# Patient Record
Sex: Male | Born: 1975 | Hispanic: Yes | Marital: Married | Smoking: Current some day smoker
Health system: Southern US, Community
[De-identification: ages and names within clinical notes are randomized; demographics above are authoritative.]

## PROBLEM LIST (undated history)

## (undated) DIAGNOSIS — Z87442 Personal history of urinary calculi: Secondary | ICD-10-CM

## (undated) HISTORY — PX: NO PAST SURGERIES: SHX2092

---

## 2006-01-12 DIAGNOSIS — Z87442 Personal history of urinary calculi: Secondary | ICD-10-CM

## 2006-01-12 HISTORY — DX: Personal history of urinary calculi: Z87.442

## 2016-10-23 ENCOUNTER — Ambulatory Visit (INDEPENDENT_AMBULATORY_CARE_PROVIDER_SITE_OTHER): Payer: Worker's Compensation

## 2016-10-23 ENCOUNTER — Ambulatory Visit: Payer: Worker's Compensation

## 2016-10-23 ENCOUNTER — Encounter (HOSPITAL_BASED_OUTPATIENT_CLINIC_OR_DEPARTMENT_OTHER): Payer: Self-pay | Admitting: *Deleted

## 2016-10-23 ENCOUNTER — Ambulatory Visit: Payer: Self-pay

## 2016-10-23 ENCOUNTER — Encounter: Payer: Self-pay | Admitting: Physician Assistant

## 2016-10-23 ENCOUNTER — Ambulatory Visit (INDEPENDENT_AMBULATORY_CARE_PROVIDER_SITE_OTHER): Payer: Worker's Compensation | Admitting: Physician Assistant

## 2016-10-23 VITALS — BP 130/83 | HR 77 | Temp 98.7°F | Resp 16

## 2016-10-23 DIAGNOSIS — S82892A Other fracture of left lower leg, initial encounter for closed fracture: Secondary | ICD-10-CM

## 2016-10-23 DIAGNOSIS — M21962 Unspecified acquired deformity of left lower leg: Secondary | ICD-10-CM | POA: Diagnosis not present

## 2016-10-23 MED ORDER — IBUPROFEN 200 MG PO TABS
600.0000 mg | ORAL_TABLET | Freq: Once | ORAL | Status: AC
  Administered 2016-10-23: 600 mg via ORAL

## 2016-10-23 NOTE — H&P (Signed)
PREOPERATIVE H&P  Chief Complaint: left ankle fracture  HPI: Johnathan Duncan is a 41 y.o. male who presents for preoperative history and physical with a diagnosis of left ankle fracture. Symptoms are rated as moderate to severe, and have been worsening.  This is significantly impairing activities of daily living.  He has elected for surgical management.   Past Medical History:  Diagnosis Date  . Medical history non-contributory    Past Surgical History:  Procedure Laterality Date  . NO PAST SURGERIES     Social History   Social History  . Marital status: Married    Spouse name: N/A  . Number of children: N/A  . Years of education: N/A   Social History Main Topics  . Smoking status: Never Smoker  . Smokeless tobacco: Never Used  . Alcohol use No  . Drug use: No  . Sexual activity: Not Asked   Other Topics Concern  . None   Social History Narrative  . None   History reviewed. No pertinent family history. No Known Allergies Prior to Admission medications   Not on File     Positive ROS: All other systems have been reviewed and were otherwise negative with the exception of those mentioned in the HPI and as above.  Physical Exam: General: Alert, no acute distress Cardiovascular: No pedal edema Respiratory: No cyanosis, no use of accessory musculature GI: No organomegaly, abdomen is soft and non-tender Skin: No lesions in the area of chief complaint Neurologic: Sensation intact distally Psychiatric: Patient is competent for consent with normal mood and affect Lymphatic: No axillary or cervical lymphadenopathy  MUSCULOSKELETAL: LLE: ttp at ankle with obvious deformity, wwp toes, distally NVID  Assessment: left ankle fracture  Plan: Plan for Procedure(s): OPEN REDUCTION INTERNAL FIXATION (ORIF) LEFT BIMALLEOLAR ANKLE FRACTURE AND ORIF SYNDESIS  The risks benefits and alternatives were discussed with the patient including but not limited to the risks of  nonoperative treatment, versus surgical intervention including infection, bleeding, nerve injury,  blood clots, cardiopulmonary complications, morbidity, mortality, among others, and they were willing to proceed.   Bjorn Pippin, MD  10/23/2016 2:00 PM

## 2016-10-23 NOTE — Patient Instructions (Addendum)
Go directly to [facility] Emergency Department.   Dg Ankle Complete Left  Result Date: 10/23/2016 CLINICAL DATA:  Left ankle deformity. Tree fell on leg. EXAM: LEFT ANKLE COMPLETE - 3+ VIEW COMPARISON:  None. FINDINGS: There is a transverse oblique fracture of the distal shaft of the left fibula 5 cm above the ankle joint. Fracture is foreshortened by 7 mm with the distal fracture component displaced laterally by 6 mm. There is a transverse fracture across the base of the medial malleolus, which is displaced laterally by 6 mm. There is a fracture along the posterolateral corner of the tibia involving the articular surface, displaced laterally by 6 mm. The talus is subluxed 6 mm laterally and tilted laterally. No dislocation.  There is surrounding soft tissue swelling. IMPRESSION: 1. Fractured left ankle with a fracture of the distal left fibular shaft and fractures of the medial malleolus and the posterolateral margin of the distal tibia. Fractures are displaced laterally by 6 mm with associated lateral talar subluxation. Electronically Signed   By: Amie Portland M.D.   On: 10/23/2016 09:10

## 2016-10-23 NOTE — Progress Notes (Signed)
10/23/2016 9:14 AM   DOB: March 14, 1975 / MRN: [id number]  SUBJECTIVE:  Johnathan Duncan is a 41 y.o. male presenting for left ankle injury after a tree fell on his leg today.  He can not ambulate on the joint.  Has taken 200 mg of ibuprofen so far.  Denies numbness.    He has No Known Allergies.   He  has no past medical history on file.    He  reports that he has never smoked. He has never used smokeless tobacco. He reports that he does not drink alcohol or use drugs. He  has no sexual activity history on file. The patient  has no past surgical history on file.  His family history is not on file.  Review of Systems  Constitutional: Negative for chills, diaphoresis and fever.  Respiratory: Negative for shortness of breath.   Gastrointestinal: Negative for nausea.  Musculoskeletal: Positive for joint pain.  Skin: Negative for rash.  Neurological: Negative for dizziness.    The problem list and medications were reviewed and updated by myself where necessary and exist elsewhere in the encounter.   OBJECTIVE:  BP 130/83 (BP Location: Left Arm, Patient Position: Sitting, Cuff Size: Normal)   Pulse 77   Temp 98.7 F (37.1 C) (Oral)   Resp 16   SpO2 98%   Physical Exam  Constitutional: He appears well-developed. He is active and cooperative.  Non-toxic appearance.  Cardiovascular: Normal rate, S1 normal, S2 normal, intact distal pulses and normal pulses.  Exam reveals no gallop and no friction rub.   No murmur heard. Pulmonary/Chest: Effort normal. No tachypnea. He has no rales.  Abdominal: He exhibits no distension.  Musculoskeletal: He exhibits tenderness and deformity. He exhibits no edema.  Neurological: He is alert.  Skin: Skin is warm and dry. He is not diaphoretic. No pallor.  Vitals reviewed.   No results found for this or any previous visit (from the past 72 hour(s)).  Dg Ankle Complete Left  Result Date: 10/23/2016 CLINICAL DATA:  Left ankle deformity. Tree fell  on leg. EXAM: LEFT ANKLE COMPLETE - 3+ VIEW COMPARISON:  None. FINDINGS: There is a transverse oblique fracture of the distal shaft of the left fibula 5 cm above the ankle joint. Fracture is foreshortened by 7 mm with the distal fracture component displaced laterally by 6 mm. There is a transverse fracture across the base of the medial malleolus, which is displaced laterally by 6 mm. There is a fracture along the posterolateral corner of the tibia involving the articular surface, displaced laterally by 6 mm. The talus is subluxed 6 mm laterally and tilted laterally. No dislocation.  There is surrounding soft tissue swelling. IMPRESSION: 1. Fractured left ankle with a fracture of the distal left fibular shaft and fractures of the medial malleolus and the posterolateral margin of the distal tibia. Fractures are displaced laterally by 6 mm with associated lateral talar subluxation. Electronically Signed   By: Amie Portland M.D.   On: 10/23/2016 09:10    ASSESSMENT AND PLAN:  Johnathan Duncan was seen today for foot injury.  Diagnoses and all orders for this visit:  Left ankle joint deformity:  -     DG Ankle Complete Left; Future -     Cancel: DG Tibia/Fibula Left; Future -     Cancel: DG Foot 2 Views Left; Future -     ibuprofen (ADVIL,MOTRIN) tablet 600 mg; Take 3 tablets (600 mg total) by mouth once.  Closed fracture of left ankle,  initial encounter: See rads.Spoke with Dr. Aundria Rud who is on call of [facility].  Dr. Aundria Rud advised this can be splinted after reduction. Unfortuantely no-one in our office is comfortable with that procedure. Advised patient to go directly to [facility] for reduction and splinting. Patient to go by private vehicle.      The patient is advised to call or return to clinic if he does not see an improvement in symptoms, or to seek the care of the closest emergency department if he worsens with the above plan.   Deliah Boston, MHS, PA-C Primary [facility] at [facility] 10/23/2016 9:14 AM

## 2016-10-26 ENCOUNTER — Ambulatory Visit (HOSPITAL_BASED_OUTPATIENT_CLINIC_OR_DEPARTMENT_OTHER)
Admission: RE | Admit: 2016-10-26 | Discharge: 2016-10-26 | Disposition: A | Discharge status: Home / Self Care | Payer: Worker's Compensation | Source: Ambulatory Visit | Attending: Orthopaedic Surgery | Admitting: Orthopaedic Surgery

## 2016-10-26 ENCOUNTER — Encounter (HOSPITAL_BASED_OUTPATIENT_CLINIC_OR_DEPARTMENT_OTHER): Payer: Self-pay | Admitting: *Deleted

## 2016-10-26 ENCOUNTER — Encounter (HOSPITAL_BASED_OUTPATIENT_CLINIC_OR_DEPARTMENT_OTHER)
Admission: RE | Disposition: A | Discharge status: Home / Self Care | Payer: Self-pay | Source: Ambulatory Visit | Attending: Orthopaedic Surgery

## 2016-10-26 ENCOUNTER — Ambulatory Visit (HOSPITAL_BASED_OUTPATIENT_CLINIC_OR_DEPARTMENT_OTHER): Payer: Worker's Compensation | Admitting: Anesthesiology

## 2016-10-26 DIAGNOSIS — S93432A Sprain of tibiofibular ligament of left ankle, initial encounter: Secondary | ICD-10-CM | POA: Insufficient documentation

## 2016-10-26 DIAGNOSIS — S82842A Displaced bimalleolar fracture of left lower leg, initial encounter for closed fracture: Secondary | ICD-10-CM | POA: Diagnosis present

## 2016-10-26 DIAGNOSIS — W208XXA Other cause of strike by thrown, projected or falling object, initial encounter: Secondary | ICD-10-CM | POA: Insufficient documentation

## 2016-10-26 DIAGNOSIS — Y9289 Other specified places as the place of occurrence of the external cause: Secondary | ICD-10-CM | POA: Insufficient documentation

## 2016-10-26 HISTORY — PX: ORIF ANKLE FRACTURE: SHX5408

## 2016-10-26 HISTORY — DX: Personal history of urinary calculi: Z87.442

## 2016-10-26 SURGERY — OPEN REDUCTION INTERNAL FIXATION (ORIF) ANKLE FRACTURE
Anesthesia: General | Site: Ankle | Laterality: Left

## 2016-10-26 MED ORDER — NAPROXEN 250 MG PO TABS
250.0000 mg | ORAL_TABLET | Freq: Two times a day (BID) | ORAL | 0 refills | Status: AC
Start: 2016-10-26 — End: 2016-11-09

## 2016-10-26 MED ORDER — SODIUM CHLORIDE 0.9 % IJ SOLN
INTRAMUSCULAR | Status: AC
  Filled 2016-10-26: qty 10

## 2016-10-26 MED ORDER — MIDAZOLAM HCL 2 MG/2ML IJ SOLN
1.0000 mg | INTRAMUSCULAR | Status: DC | PRN
  Administered 2016-10-26: 2 mg via INTRAVENOUS

## 2016-10-26 MED ORDER — HYDROMORPHONE HCL 1 MG/ML IJ SOLN
0.2500 mg | INTRAMUSCULAR | Status: DC | PRN
  Administered 2016-10-26: 14:00:00 via INTRAVENOUS

## 2016-10-26 MED ORDER — LIDOCAINE HCL (CARDIAC) 20 MG/ML IV SOLN
INTRAVENOUS | Status: DC | PRN
  Administered 2016-10-26: 30 mg via INTRAVENOUS

## 2016-10-26 MED ORDER — PROPOFOL 10 MG/ML IV BOLUS
INTRAVENOUS | Status: AC
  Filled 2016-10-26: qty 20

## 2016-10-26 MED ORDER — LACTATED RINGERS IV SOLN
INTRAVENOUS | Status: DC
  Administered 2016-10-26 (×3): via INTRAVENOUS

## 2016-10-26 MED ORDER — MIDAZOLAM HCL 2 MG/2ML IJ SOLN
INTRAMUSCULAR | Status: AC
  Filled 2016-10-26: qty 2

## 2016-10-26 MED ORDER — ONDANSETRON HCL 4 MG/2ML IJ SOLN
INTRAMUSCULAR | Status: DC | PRN
  Administered 2016-10-26: 4 mg via INTRAVENOUS

## 2016-10-26 MED ORDER — CEFAZOLIN SODIUM 1 G IJ SOLR
INTRAMUSCULAR | Status: AC
  Filled 2016-10-26: qty 20

## 2016-10-26 MED ORDER — MIDAZOLAM HCL 2 MG/2ML IJ SOLN: 0.5000 mg | Freq: Once | INTRAMUSCULAR | Status: DC | PRN

## 2016-10-26 MED ORDER — OXYCODONE HCL 5 MG PO TABS: ORAL_TABLET | ORAL | 0 refills | Status: AC

## 2016-10-26 MED ORDER — ONDANSETRON HCL 4 MG PO TABS: 4.0000 mg | ORAL_TABLET | Freq: Three times a day (TID) | ORAL | 1 refills | Status: AC | PRN

## 2016-10-26 MED ORDER — FENTANYL CITRATE (PF) 100 MCG/2ML IJ SOLN
INTRAMUSCULAR | Status: DC | PRN
  Administered 2016-10-26: 100 ug via INTRAVENOUS

## 2016-10-26 MED ORDER — DEXAMETHASONE SODIUM PHOSPHATE 10 MG/ML IJ SOLN
INTRAMUSCULAR | Status: AC
  Filled 2016-10-26: qty 1

## 2016-10-26 MED ORDER — SCOPOLAMINE 1 MG/3DAYS TD PT72: 1.0000 | MEDICATED_PATCH | Freq: Once | TRANSDERMAL | Status: DC | PRN

## 2016-10-26 MED ORDER — MIDAZOLAM HCL 5 MG/5ML IJ SOLN
INTRAMUSCULAR | Status: DC | PRN
  Administered 2016-10-26: 2 mg via INTRAVENOUS

## 2016-10-26 MED ORDER — OMEPRAZOLE 20 MG PO CPDR: 20.0000 mg | DELAYED_RELEASE_CAPSULE | Freq: Every day | ORAL | 0 refills | Status: AC

## 2016-10-26 MED ORDER — BUPIVACAINE-EPINEPHRINE (PF) 0.5% -1:200000 IJ SOLN
INTRAMUSCULAR | Status: DC | PRN
  Administered 2016-10-26 (×2): 20 mL via PERINEURAL

## 2016-10-26 MED ORDER — ACETAMINOPHEN 500 MG PO TABS: 1000.0000 mg | ORAL_TABLET | Freq: Three times a day (TID) | ORAL | 0 refills | Status: AC

## 2016-10-26 MED ORDER — PROPOFOL 10 MG/ML IV BOLUS
INTRAVENOUS | Status: DC | PRN
  Administered 2016-10-26: 50 mg via INTRAVENOUS
  Administered 2016-10-26: 150 mg via INTRAVENOUS

## 2016-10-26 MED ORDER — ONDANSETRON HCL 4 MG/2ML IJ SOLN
INTRAMUSCULAR | Status: AC
  Filled 2016-10-26: qty 2

## 2016-10-26 MED ORDER — BISACODYL 5 MG PO TBEC: 5.0000 mg | DELAYED_RELEASE_TABLET | Freq: Every day | ORAL | 0 refills | Status: AC | PRN

## 2016-10-26 MED ORDER — DEXAMETHASONE SODIUM PHOSPHATE 10 MG/ML IJ SOLN
INTRAMUSCULAR | Status: DC | PRN
  Administered 2016-10-26: 10 mg via INTRAVENOUS

## 2016-10-26 MED ORDER — LIDOCAINE 2% (20 MG/ML) 5 ML SYRINGE
INTRAMUSCULAR | Status: AC
  Filled 2016-10-26: qty 5

## 2016-10-26 MED ORDER — MEPERIDINE HCL 25 MG/ML IJ SOLN: 6.2500 mg | INTRAMUSCULAR | Status: DC | PRN

## 2016-10-26 MED ORDER — FENTANYL CITRATE (PF) 100 MCG/2ML IJ SOLN
INTRAMUSCULAR | Status: AC
  Filled 2016-10-26: qty 2

## 2016-10-26 MED ORDER — FENTANYL CITRATE (PF) 100 MCG/2ML IJ SOLN
50.0000 ug | INTRAMUSCULAR | Status: DC | PRN
  Administered 2016-10-26: 100 ug via INTRAVENOUS

## 2016-10-26 MED ORDER — CHLORHEXIDINE GLUCONATE 4 % EX LIQD: 60.0000 mL | Freq: Once | CUTANEOUS | Status: DC

## 2016-10-26 MED ORDER — CEFAZOLIN SODIUM-DEXTROSE 2-4 GM/100ML-% IV SOLN
2.0000 g | INTRAVENOUS | Status: AC
  Administered 2016-10-26: 2 g via INTRAVENOUS

## 2016-10-26 MED ORDER — PROMETHAZINE HCL 25 MG/ML IJ SOLN
6.2500 mg | INTRAMUSCULAR | Status: DC | PRN
  Administered 2016-10-26: 6.25 mg via INTRAVENOUS

## 2016-10-26 MED ORDER — PROMETHAZINE HCL 25 MG/ML IJ SOLN
INTRAMUSCULAR | Status: AC
  Filled 2016-10-26: qty 1

## 2016-10-26 MED ORDER — ASPIRIN 81 MG PO TABS: 81.0000 mg | ORAL_TABLET | Freq: Every day | ORAL | 0 refills | Status: AC

## 2016-10-26 SURGICAL SUPPLY — 73 items
4.0 CANNULATED SCREW ×6 IMPLANT
BANDAGE ACE 4X5 VEL STRL LF (GAUZE/BANDAGES/DRESSINGS) ×3 IMPLANT
BANDAGE ACE 6X5 VEL STRL LF (GAUZE/BANDAGES/DRESSINGS) ×3 IMPLANT
BENZOIN TINCTURE PRP APPL 2/3 (GAUZE/BANDAGES/DRESSINGS) IMPLANT
BIT DRILL 2.5 CANN STRL (BIT) ×3 IMPLANT
BIT DRILL 2.6 CANN (BIT) ×3 IMPLANT
BLADE SURG 10 STRL SS (BLADE) ×3 IMPLANT
BLADE SURG 15 STRL LF DISP TIS (BLADE) ×2 IMPLANT
BLADE SURG 15 STRL SS (BLADE) ×4
BNDG COHESIVE 4X5 TAN STRL (GAUZE/BANDAGES/DRESSINGS) ×3 IMPLANT
CHLORAPREP W/TINT 26ML (MISCELLANEOUS) ×3 IMPLANT
CLEANER CAUTERY TIP 5X5 PAD (MISCELLANEOUS) ×1 IMPLANT
CLOSURE WOUND 1/2 X4 (GAUZE/BANDAGES/DRESSINGS) ×1
COVER BACK TABLE 60X90IN (DRAPES) ×3 IMPLANT
CUFF TOURNIQUET SINGLE 24IN (TOURNIQUET CUFF) ×3 IMPLANT
CUFF TOURNIQUET SINGLE 34IN LL (TOURNIQUET CUFF) IMPLANT
DECANTER SPIKE VIAL GLASS SM (MISCELLANEOUS) IMPLANT
DRAPE EXTREMITY T 121X128X90 (DRAPE) ×3 IMPLANT
DRAPE IMP U-DRAPE 54X76 (DRAPES) ×3 IMPLANT
DRAPE INCISE IOBAN 66X45 STRL (DRAPES) ×3 IMPLANT
DRAPE OEC MINIVIEW 54X84 (DRAPES) ×3 IMPLANT
DRAPE U-SHAPE 47X51 STRL (DRAPES) ×3 IMPLANT
DRSG PAD ABDOMINAL 8X10 ST (GAUZE/BANDAGES/DRESSINGS) ×3 IMPLANT
ELECT REM PT RETURN 9FT ADLT (ELECTROSURGICAL) ×3
ELECTRODE REM PT RTRN 9FT ADLT (ELECTROSURGICAL) ×1 IMPLANT
GAUZE SPONGE 4X4 12PLY STRL (GAUZE/BANDAGES/DRESSINGS) ×3 IMPLANT
GAUZE SPONGE 4X4 12PLY STRL LF (GAUZE/BANDAGES/DRESSINGS) ×3 IMPLANT
GAUZE XEROFORM 1X8 LF (GAUZE/BANDAGES/DRESSINGS) ×3 IMPLANT
GLOVE BIOGEL PI IND STRL 8 (GLOVE) ×1 IMPLANT
GLOVE BIOGEL PI INDICATOR 8 (GLOVE) ×2
GLOVE SURG ORTHO 8.0 STRL STRW (GLOVE) ×3 IMPLANT
GOWN STRL REUS W/ TWL LRG LVL3 (GOWN DISPOSABLE) ×1 IMPLANT
GOWN STRL REUS W/TWL LRG LVL3 (GOWN DISPOSABLE) ×2
GOWN STRL REUS W/TWL XL LVL3 (GOWN DISPOSABLE) ×3 IMPLANT
GUIDEWIRE 1.35MM (WIRE) ×6 IMPLANT
NEEDLE HYPO 22GX1.5 SAFETY (NEEDLE) IMPLANT
NS IRRIG 1000ML POUR BTL (IV SOLUTION) ×3 IMPLANT
PACK BASIN DAY SURGERY FS (CUSTOM PROCEDURE TRAY) ×3 IMPLANT
PAD CAST 4YDX4 CTTN HI CHSV (CAST SUPPLIES) ×1 IMPLANT
PAD CLEANER CAUTERY TIP 5X5 (MISCELLANEOUS) ×2
PADDING CAST ABS 4INX4YD NS (CAST SUPPLIES) ×4
PADDING CAST ABS COTTON 4X4 ST (CAST SUPPLIES) ×2 IMPLANT
PADDING CAST COTTON 4X4 STRL (CAST SUPPLIES) ×2
PADDING CAST COTTON 6X4 STRL (CAST SUPPLIES) ×3 IMPLANT
PENCIL BUTTON HOLSTER BLD 10FT (ELECTRODE) ×3 IMPLANT
PLATE THIRD TUBULAR 8 HOLE (Plate) ×3 IMPLANT
REPAIR TROPE KNTLS SS SYNDESMO (Orthopedic Implant) ×3 IMPLANT
SCREW LOW PROFILE 3.5X14 (Screw) ×6 IMPLANT
SCREW LOW PROFILE 3.5X16 (Screw) ×6 IMPLANT
SCREW NON-LOCKING 3.5X12MM (Screw) ×3 IMPLANT
SLEEVE SCD COMPRESS KNEE MED (MISCELLANEOUS) IMPLANT
SPLINT FAST PLASTER 5X30 (CAST SUPPLIES) ×40
SPLINT PLASTER CAST FAST 5X30 (CAST SUPPLIES) ×20 IMPLANT
SPONGE LAP 18X18 X RAY DECT (DISPOSABLE) ×3 IMPLANT
STRIP CLOSURE SKIN 1/2X4 (GAUZE/BANDAGES/DRESSINGS) ×2 IMPLANT
SUCTION FRAZIER HANDLE 10FR (MISCELLANEOUS) ×2
SUCTION TUBE FRAZIER 10FR DISP (MISCELLANEOUS) ×1 IMPLANT
SUT ETHILON 3 0 PS 1 (SUTURE) IMPLANT
SUT MNCRL AB 4-0 PS2 18 (SUTURE) ×3 IMPLANT
SUT MON AB 3-0 SH 27 (SUTURE)
SUT MON AB 3-0 SH27 (SUTURE) IMPLANT
SUT VIC AB 0 SH 27 (SUTURE) ×3 IMPLANT
SUT VIC AB 2-0 SH 27 (SUTURE)
SUT VIC AB 2-0 SH 27XBRD (SUTURE) IMPLANT
SYR BULB 3OZ (MISCELLANEOUS) ×3 IMPLANT
SYR CONTROL 10ML LL (SYRINGE) IMPLANT
TOWEL OR 17X24 6PK STRL BLUE (TOWEL DISPOSABLE) ×6 IMPLANT
TOWEL OR NON WOVEN STRL DISP B (DISPOSABLE) ×3 IMPLANT
TUBE CONNECTING 20'X1/4 (TUBING) ×1
TUBE CONNECTING 20X1/4 (TUBING) ×2 IMPLANT
UNDERPAD 30X30 (UNDERPADS AND DIAPERS) ×3 IMPLANT
Washer ×6 IMPLANT
YANKAUER SUCT BULB TIP NO VENT (SUCTIONS) ×3 IMPLANT

## 2016-10-26 NOTE — Interval H&P Note (Signed)
History and Physical Interval Note:  10/26/2016 11:04 AM  Johnathan Duncan  has presented today for surgery, with the diagnosis of left ankle fracture  The various methods of treatment have been discussed with the patient and family. After consideration of risks, benefits and other options for treatment, the patient has consented to  Procedure(s): OPEN REDUCTION INTERNAL FIXATION (ORIF) LEFT BIMALLEOLAR ANKLE FRACTURE AND ORIF SYNDESIS (Left) as a surgical intervention .  The patient's history has been reviewed, patient examined, no change in status, stable for surgery.  I have reviewed the patient's chart and labs.  Questions were answered to the patient's satisfaction.    I had an extensive discussion with the patient and his wife with the help of an interpreter again going through the risks and benefits of operative vs non-operative management.  Due to his syndesmosis injury and the unstable nature of his fracture operative management I feel will allow a higher chance at a successful outcome and earlier return to weight bearing.  Specific risks including the need for further surgery, continued pain, hardware removal, infection and nerve issues were all discussed.  They elected to proceed understanding all these risks.   Bjorn Pippin

## 2016-10-26 NOTE — Anesthesia Procedure Notes (Signed)
Procedure Name: LMA Insertion Date/Time: 10/26/2016 11:48 AM Performed by: Genevieve Norlander L Pre-anesthesia Checklist: Patient identified, Emergency Drugs available, Suction available, Patient being monitored and Timeout performed Patient Re-evaluated:Patient Re-evaluated prior to induction Oxygen Delivery Method: Circle system utilized Preoxygenation: Pre-oxygenation with 100% oxygen Induction Type: IV induction Ventilation: Mask ventilation without difficulty LMA: LMA inserted LMA Size: 4.0 Number of attempts: 1 Airway Equipment and Method: Bite block Placement Confirmation: positive ETCO2 Tube secured with: Tape Dental Injury: Teeth and Oropharynx as per pre-operative assessment

## 2016-10-26 NOTE — Anesthesia Postprocedure Evaluation (Signed)
Anesthesia Post Note  Patient: Johnathan Duncan  Procedure(s) Performed: OPEN REDUCTION INTERNAL FIXATION (ORIF) LEFT BIMALLEOLAR ANKLE FRACTURE AND ORIF SYNDESIS (Left Ankle)     Patient location during evaluation: PACU Anesthesia Type: General Level of consciousness: awake and alert Pain management: pain level controlled Vital Signs Assessment: post-procedure vital signs reviewed and stable Respiratory status: spontaneous breathing, nonlabored ventilation, respiratory function stable and patient connected to nasal cannula oxygen Cardiovascular status: blood pressure returned to baseline and stable Postop Assessment: no apparent nausea or vomiting Anesthetic complications: no    Last Vitals:  Vitals:   10/26/16 1400 10/26/16 1415  BP: 132/83 136/84  Pulse: 96 93  Resp: 13 12  Temp:    SpO2: 100% 100%    Last Pain:  Vitals:   10/26/16 1400  TempSrc:   PainSc: 0-No pain                 Wah Sabic

## 2016-10-26 NOTE — Discharge Instructions (Signed)

## 2016-10-26 NOTE — Anesthesia Procedure Notes (Signed)
Anesthesia Regional Block: Adductor canal block   Pre-Anesthetic Checklist: ,, timeout performed, Correct Patient, Correct Site, Correct Laterality, Correct Procedure, Correct Position, site marked, Risks and benefits discussed,  Surgical consent,  Pre-op evaluation,  At surgeon's request and post-op pain management  Laterality: Left and Lower  Prep: chloraprep       Needles:  Injection technique: Single-shot  Needle Type: Echogenic Needle     Needle Length: 9cm  Needle Gauge: 21     Additional Needles:   Procedures:,,,, ultrasound used (permanent image in chart),,,,  Narrative:  Start time: 10/26/2016 11:09 AM End time: 10/26/2016 11:15 AM Injection made incrementally with aspirations every 5 mL.  Performed by: Personally  Anesthesiologist: Jean Rosenthal, Leodis Alcocer  Additional Notes: Pt identified in Holding room.  Monitors applied. Working IV access confirmed. Sterile prep, drape L thigh.  #21ga ECHOgenic needle into adductor canal with US guidance.  20 cc 0.5% Bupivacaine with 1:200k epi injected incrementally after negative test dose.  Patient asymptomatic, VSS, no heme aspirated, tolerated well.  Sandford Craze, MD

## 2016-10-26 NOTE — Progress Notes (Signed)
Assisted Dr. Jairo Ben with left, popliteal/saphenous, adductor canal block. Side rails up, monitors on throughout procedure. See vital signs in flow sheet. Tolerated Procedure well.

## 2016-10-26 NOTE — Anesthesia Preprocedure Evaluation (Signed)
Anesthesia Evaluation  Patient identified by MRN, date of birth, ID band Patient awake    Reviewed: Allergy & Precautions, NPO status , Patient's Chart, lab work & pertinent test results  History of Anesthesia Complications Negative for: history of anesthetic complications  Airway Mallampati: II  TM Distance: >3 FB Neck ROM: Full    Dental  (+) Poor Dentition, Chipped, Missing, Dental Advisory Given   Pulmonary Current Smoker,    breath sounds clear to auscultation       Cardiovascular negative cardio ROS   Rhythm:Regular Rate:Normal     Neuro/Psych negative neurological ROS     GI/Hepatic negative GI ROS, Neg liver ROS,   Endo/Other  negative endocrine ROS  Renal/GU negative Renal ROS     Musculoskeletal   Abdominal   Peds  Hematology negative hematology ROS (+)   Anesthesia Other Findings   Reproductive/Obstetrics                             Anesthesia Physical Anesthesia Plan  ASA: II  Anesthesia Plan: General   Post-op Pain Management: GA combined w/ Regional for post-op pain   Induction: Intravenous  PONV Risk Score and Plan: 1 and Ondansetron and Dexamethasone  Airway Management Planned: LMA  Additional Equipment:   Intra-op Plan:   Post-operative Plan:   Informed Consent: I have reviewed the patients History and Physical, chart, labs and discussed the procedure including the risks, benefits and alternatives for the proposed anesthesia with the patient or authorized representative who has indicated his/her understanding and acceptance.   Dental advisory given  Plan Discussed with: CRNA and Surgeon  Anesthesia Plan Comments: (Plan routine monitors, GA- LMA OK, popliteal and adductor blocks for post op analgesia)        Anesthesia Quick Evaluation

## 2016-10-26 NOTE — Interval H&P Note (Deleted)
History and Physical Interval Note:  10/26/2016 10:36 AM  Johnathan Duncan  has presented today for surgery, with the diagnosis of left ankle fracture  The various methods of treatment have been discussed with the patient and family. After consideration of risks, benefits and other options for treatment, the patient has consented to  Procedure(s): OPEN REDUCTION INTERNAL FIXATION (ORIF) LEFT BIMALLEOLAR ANKLE FRACTURE AND ORIF SYNDESIS (Left) as a surgical intervention .  The patient's history has been reviewed, patient examined, no change in status, stable for surgery.  I have reviewed the patient's chart and labs.  Questions were answered to the patient's satisfaction.     Bjorn Pippin

## 2016-10-26 NOTE — Transfer of Care (Signed)
Immediate Anesthesia Transfer of Care Note  Patient: Johnathan Duncan  Procedure(s) Performed: OPEN REDUCTION INTERNAL FIXATION (ORIF) LEFT BIMALLEOLAR ANKLE FRACTURE AND ORIF SYNDESIS (Left Ankle)  Patient Location: PACU  Anesthesia Type:GA combined with regional for post-op pain  Level of Consciousness: sedated  Airway & Oxygen Therapy: Patient Spontanous Breathing and Patient connected to face mask oxygen  Post-op Assessment: Report given to RN and Post -op Vital signs reviewed and stable  Post vital signs: Reviewed and stable  Last Vitals:  Vitals:   10/26/16 1130 10/26/16 1135  BP: 135/81 134/75  Pulse: 98 96  Resp: 16 16  Temp:    SpO2: 100% 100%    Last Pain:  Vitals:   10/26/16 1125  TempSrc:   PainSc: 0-No pain      Patients Stated Pain Goal: 3 (10/26/16 1101)  Complications: No apparent anesthesia complications

## 2016-10-26 NOTE — Anesthesia Procedure Notes (Deleted)
Performed by: Kamarri Fischetti L       

## 2016-10-26 NOTE — Op Note (Signed)
Orthopaedic Surgery Operative Note (CSN: [id number])  Johnathan Duncan  1975/03/01 Date of Surgery: 10/26/2016 Admit Date: 10/26/2016  Diagnoses:  Left bimalleolar ankle fracture with syndesmosis injury  Post-Op Diagnosis: Same  Procedure:    OPEN REDUCTION INTERNAL FIXATION (ORIF) LEFT BIMALLEOLAR ANKLE FRACTURE AND ORIF SYNDESMOSIS 78295 and 62130     Operative Finding Successful completion of planned procedure.  Bone quality good.  Fibular anti-glide plate placed with anatomic reduction. Medial malleolar ORIF performed under direct visualization with anatomic reduction and compression fracture site. Syndesmosis tight rope placed with appropriate reduction maintenance of the syndesmosis  Post-operative plan: The patient will be nonweightbearing in a splint.  The patient will be discharged home.  DVT prophylaxis aspirin 81 mg.  Pain control with PRN pain medication preferring oral medicines.  Follow up plan will be scheduled in approximately 10-14 days for wound check an three-view ankle films out of splint.  Surgeons:Primary: Bjorn Pippin, MD Location: [facility] OR ROOM 6 Anesthesia: Choice Antibiotics: Ancef 2g preop Tourniquet time: 75 minutes Estimated Blood Loss: less than 10 mL Complications: None Specimens: None Implants:  Implant Name Type Inv. Item Serial No. Manufacturer Lot No. LRB No. Used Action  TIGHTROPE KNOTLESS REPAIR K SS - [id number] Orthopedic Implant TIGHTROPE KNOTLESS REPAIR K SS  [employer] [id number] Left 1 Implanted  PLATE THIRD TUBULAR 8 HOLE - [id number] Plate PLATE THIRD TUBULAR 8 HOLE  [employer]  Left 1 Implanted  SCREW LOW PROFILE 3.5X14 - [id number] Screw SCREW LOW PROFILE 3.5X14  [employer]  Left 4 Implanted  SCREW LOW PROFILE 3.5X16 - [id number] Screw SCREW LOW PROFILE 3.5X16  [employer]  Left 2 Implanted  SCREW NON-LOCKING 3.5X12MM - [id number] Screw SCREW NON-LOCKING 3.5X12MM  [employer]  Left 1 Implanted  [place]    [employer]  Left 2 Implanted  4.0  CANNULATED SCREW       [employer]   Left 2 Implanted    Indications for Surgery:   Johnathan Duncan is a 41 y.o. male with traumatic injury with a tree falling on his left ankle resulting in a displaced bimalleolar ankle fracture with syndesmosis injury.  Patient was reduced and splinted in clinic and we explained at length to the patient, his friends, and his wife risks and benefits of nonoperative and operative measures.  Benefits and risks of operative and nonoperative management were discussed prior to surgery with patient/guardian(s) and informed consent form was completed.  Specific risks including infection, need for additional surgery, hardware removal and continued pain.   Procedure:   The patient was identified in the preoperative holding area where the surgical site was marked. The patient was taken to the OR where a procedural timeout was called and the above noted anesthesia was induced.  The patient was positioned supine with a bump under his operative hip and a bone foam in place.  Preoperative antibiotics were dosed.  The patient's left ankle was prepped and draped in the usual sterile fashion.  A second preoperative timeout was called.      A tourniquet was used for the above listed time.  We began with our ORIF of the fibula. A longitudinal approach was made along the lateral border of the fibula centered at the fracture site. We dissected down taking care to avoid the superficial peroneal nerve which crossed proximal to our incision. We encountered the fracture site and noted a shortoblique fracture in the fibular shaft. The bone quality was normal. We are able to reduce it anatomically and  hold it provisionally with a clamp and a K wire. We identified that the fracture was not amenable to lag screw fixation as this would interfere with our plate position.  We thus decided placed the plate as a buttress plate.  The plate was placed and the first screw was placed at the axilla just  proximal to the fracture site.  We then filled 3 holes distal to the fracture site and 4 holes proximal achieving bicortical fixation with all screws.  We confirmed anatomic reduction of fluoroscopy and then turned our attention to the medial side.  An oblique incision was made over the anterior aspect of the medial malleolus were able to identify the fracture site itself. A point the fracture site was cleared of interposed periosteum and we were able to obtain an anatomic reduction was held with point-to-point clamp. At this point we placed 2 partially threaded 45 mm cannulated screws with washers across the fracture site achieving good purchase and good compression with each screw.   This point we turned our attention to the tight rope portion of the procedure we went back to the lateral side of the ankle and placed a guidepin through the distal most hole proximally 1 and 1/2 cm above the joint line from posterior lateral to anterior medial in the direction of the syndesmosis. It was driven through the medial incision and were able to visually check its placement medially and laterally. Fluoroscopy-guided our placement. We then overdrilled the pin with the supplied drill. We then passed our tight rope through the tunnel was created and verified that the button was flipped visually as it was within our medial incision.this point we started to tension the tight rope confirming reduction of the syndesmosis as we tensioned it maximally. Post fixation stress views demonstrate a stable syndesmosis.  The wound was thoroughly irrigated.  The wound was closed in a multilayer fashion.  A sterile dressing was placed in addition to a splint.  The patient was awoken from general anesthesia and taken to the PACU in stable condition without complication.   The incision was thoroughly irrigated and closed in a multilayer fashion.  A sterile dressing was placed as well as a splint. The patient was awoken from general  anesthesia and taken to the PACU in stable condition without complication.

## 2016-10-26 NOTE — Interval H&P Note (Signed)
History and Physical Interval Note:  10/26/2016 10:51 AM  Johnathan Duncan  has presented today for surgery, with the diagnosis of left ankle fracture  The various methods of treatment have been discussed with the patient and family. After consideration of risks, benefits and other options for treatment, the patient has consented to  Procedure(s): OPEN REDUCTION INTERNAL FIXATION (ORIF) LEFT BIMALLEOLAR ANKLE FRACTURE AND ORIF SYNDESIS (Left) as a surgical intervention .  The patient's history has been reviewed, patient examined, no change in status, stable for surgery.  I have reviewed the patient's chart and labs.  Questions were answered to the patient's satisfaction.     Bjorn Pippin

## 2016-10-26 NOTE — Anesthesia Procedure Notes (Signed)
Anesthesia Regional Block: Popliteal block   Pre-Anesthetic Checklist: ,, timeout performed, Correct Patient, Correct Site, Correct Laterality, Correct Procedure, Correct Position, site marked, Risks and benefits discussed,  Surgical consent,  Pre-op evaluation,  At surgeon's request and post-op pain management  Laterality: Left and Lower     Needles:  Injection technique: Single-shot  Needle Type: Echogenic Stimulator Needle     Needle Length: 9cm  Needle Gauge: 21     Additional Needles:   Procedures:,,,, ultrasound used (permanent image in chart),,,,   Nerve Stimulator or Paresthesia:  Response: toe dorsiflexion, 0.4 mA, 0.1 ms,  Response: toe adduction , 0.4 mA, 0.1 ms,   Additional Responses:   Narrative:  Start time: 10/26/2016 11:17 AM End time: 10/26/2016 11:28 AM Injection made incrementally with aspirations every 5 mL.  Performed by: Personally  Anesthesiologist: Jean Rosenthal, Tayvon Culley  Additional Notes: Pt identified in Holding room.  Monitors applied. Working IV access confirmed. Sterile prep, drape L lateral knee.  #21ga ECHOgenic PNS to toe twitches at 0.105mA threshold.  20cc 0.5% Bupivacaine with 1:200k epi injected incrementally after negative test dose.  Patient asymptomatic, VSS, no heme aspirated, tolerated well.  Sandford Craze, MD

## 2016-11-03 ENCOUNTER — Encounter (HOSPITAL_BASED_OUTPATIENT_CLINIC_OR_DEPARTMENT_OTHER): Payer: Self-pay | Admitting: Orthopaedic Surgery

## 2018-11-14 IMAGING — DX DG ANKLE COMPLETE 3+V*L*
4 series · 4 of 4 positions shown · non-contrast
Comparison: None.

CLINICAL DATA: Left ankle deformity. Tree fell on leg.

EXAM:
LEFT ANKLE COMPLETE - 3+ VIEW

[ankle obl (1 of 2)]
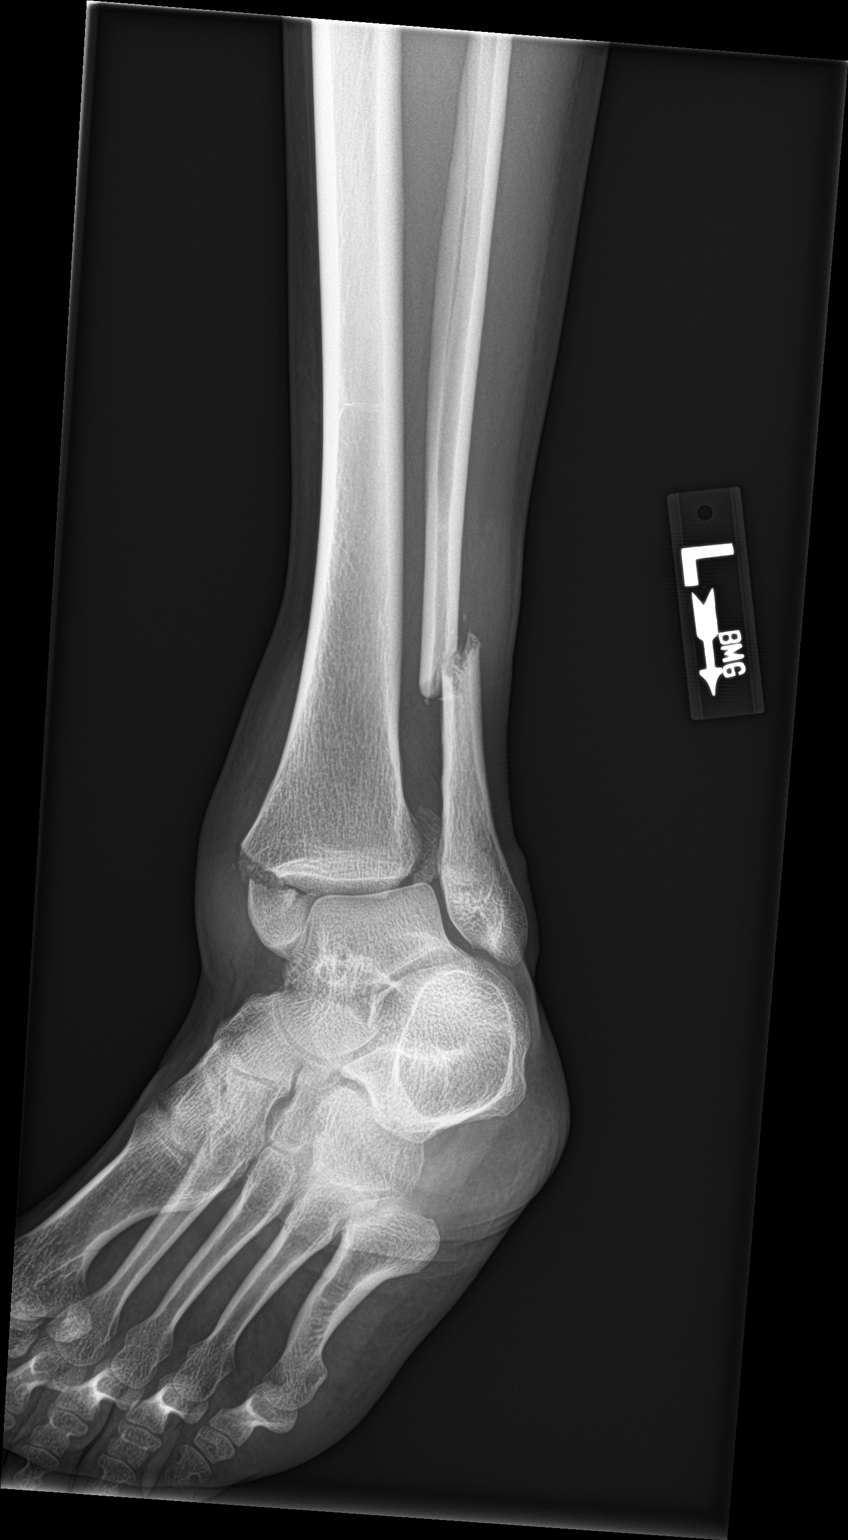

[ankle ap]
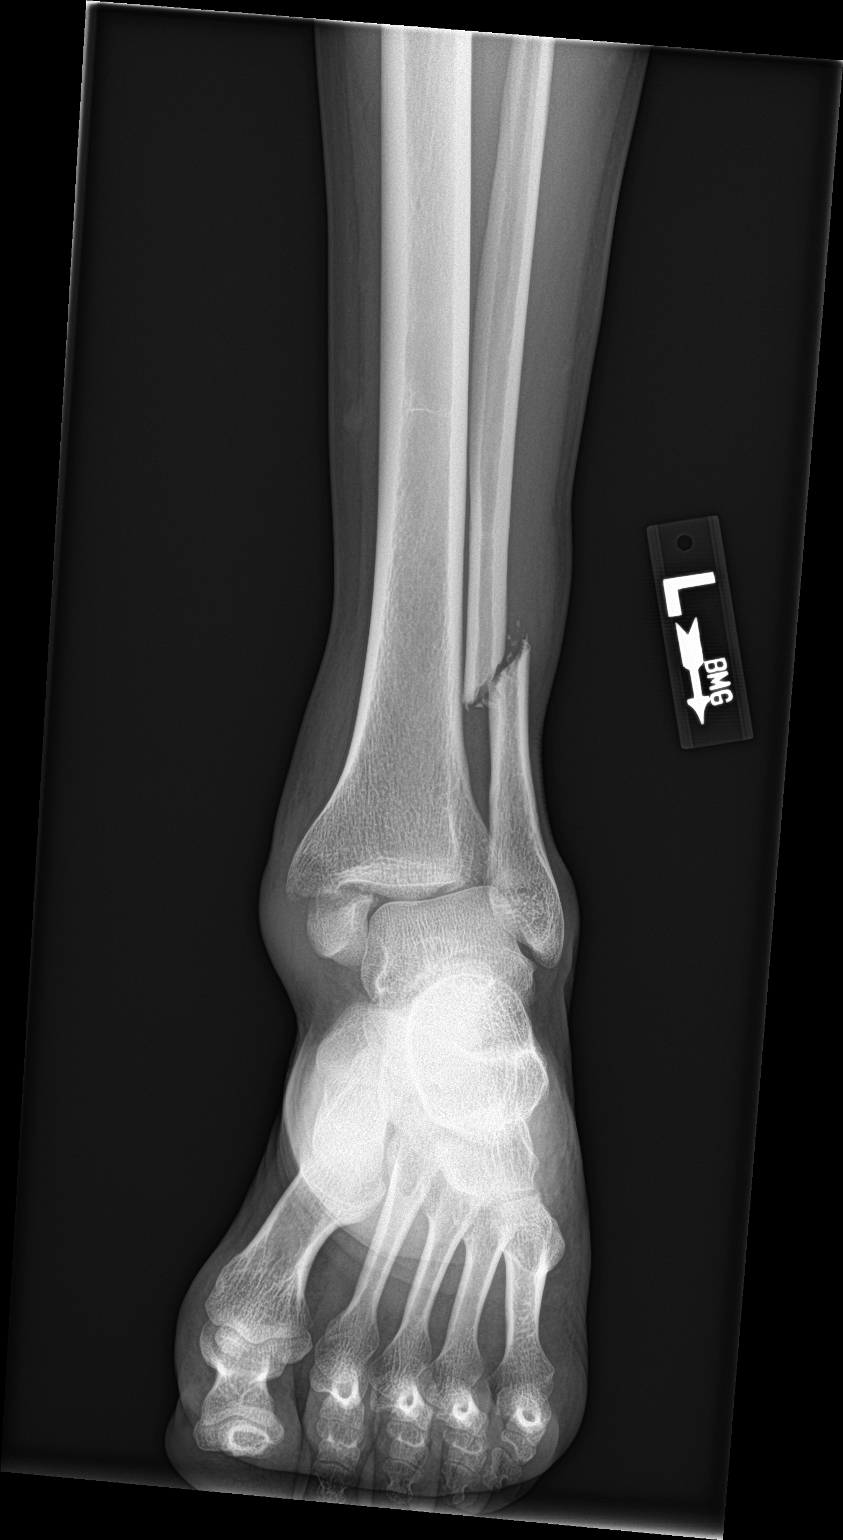

[ankle lat]
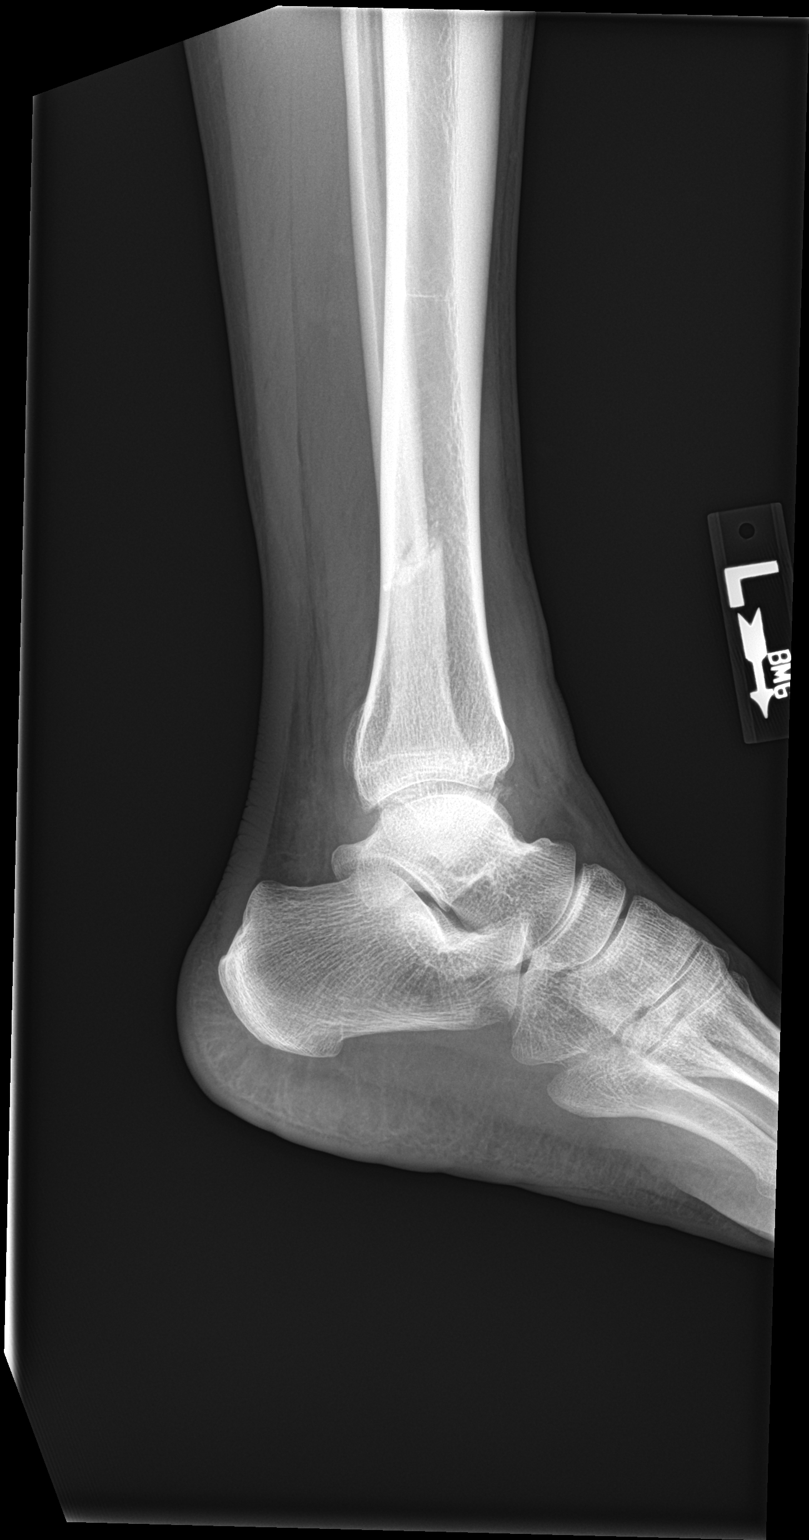

[ankle obl (2 of 2)]
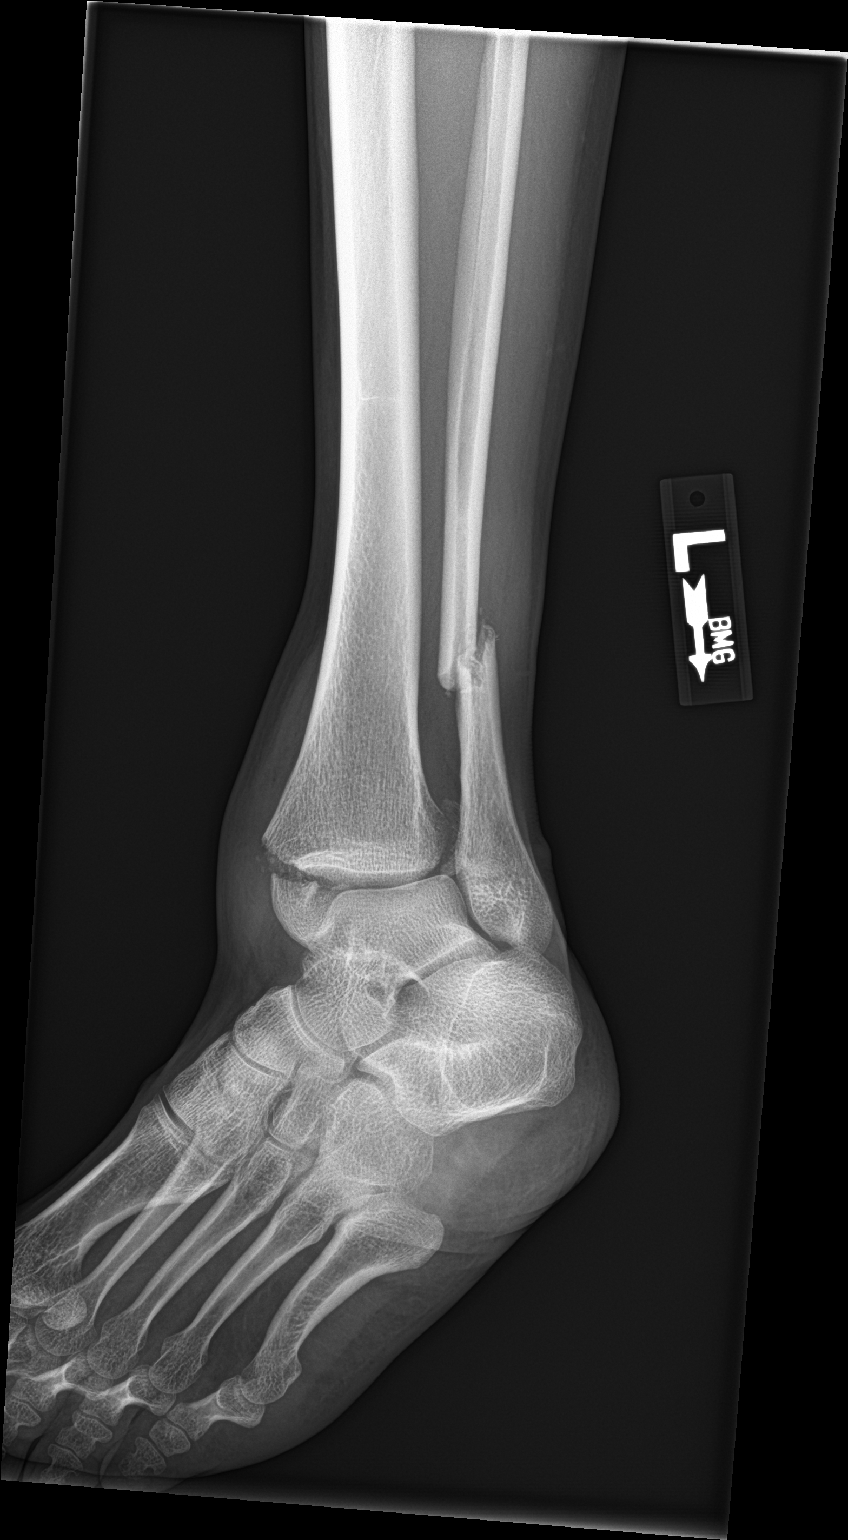

[4 of 4 positions shown; findings below may reference images not displayed]

FINDINGS: There is a transverse oblique fracture of the distal shaft of the
left fibula 5 cm above the ankle joint. Fracture is foreshortened by
7 mm with the distal fracture component displaced laterally by 6 mm.

There is a transverse fracture across the base of the medial
malleolus, which is displaced laterally by 6 mm.

There is a fracture along the posterolateral corner of the tibia
involving the articular surface, displaced laterally by 6 mm.

The talus is subluxed 6 mm laterally and tilted laterally.

No dislocation.  There is surrounding soft tissue swelling.
IMPRESSION: 1. Fractured left ankle with a fracture of the distal left fibular
shaft and fractures of the medial malleolus and the posterolateral
margin of the distal tibia. Fractures are displaced laterally by 6
mm with associated lateral talar subluxation.
